# Patient Record
Sex: Female | Born: 1965 | Race: Black or African American | Hispanic: No | Marital: Married | Smoking: Never smoker
Health system: Southern US, Community
[De-identification: ages and names within clinical notes are randomized; demographics above are authoritative.]

## PROBLEM LIST (undated history)

## (undated) DIAGNOSIS — I10 Essential (primary) hypertension: Secondary | ICD-10-CM

## (undated) HISTORY — PX: BREAST REDUCTION SURGERY: SHX8

---

## 1999-11-28 ENCOUNTER — Emergency Department (HOSPITAL_COMMUNITY): Admission: EM | Admit: 1999-11-28 | Discharge: 1999-11-28 | Payer: Self-pay | Admitting: Emergency Medicine

## 1999-11-28 ENCOUNTER — Encounter: Payer: Self-pay | Admitting: Emergency Medicine

## 2014-04-30 ENCOUNTER — Emergency Department (HOSPITAL_BASED_OUTPATIENT_CLINIC_OR_DEPARTMENT_OTHER): Payer: Managed Care, Other (non HMO)

## 2014-04-30 ENCOUNTER — Encounter (HOSPITAL_BASED_OUTPATIENT_CLINIC_OR_DEPARTMENT_OTHER): Payer: Self-pay | Admitting: Emergency Medicine

## 2014-04-30 ENCOUNTER — Emergency Department (HOSPITAL_BASED_OUTPATIENT_CLINIC_OR_DEPARTMENT_OTHER)
Admission: EM | Admit: 2014-04-30 | Discharge: 2014-04-30 | Disposition: A | Discharge status: Home / Self Care | Payer: Managed Care, Other (non HMO) | Attending: Emergency Medicine | Admitting: Emergency Medicine

## 2014-04-30 DIAGNOSIS — Z79899 Other long term (current) drug therapy: Secondary | ICD-10-CM | POA: Insufficient documentation

## 2014-04-30 DIAGNOSIS — I10 Essential (primary) hypertension: Secondary | ICD-10-CM | POA: Insufficient documentation

## 2014-04-30 DIAGNOSIS — J209 Acute bronchitis, unspecified: Secondary | ICD-10-CM | POA: Diagnosis not present

## 2014-04-30 DIAGNOSIS — R05 Cough: Secondary | ICD-10-CM | POA: Diagnosis present

## 2014-04-30 HISTORY — DX: Essential (primary) hypertension: I10

## 2014-04-30 MED ORDER — GUAIFENESIN ER 1200 MG PO TB12: ORAL_TABLET | ORAL | Status: DC

## 2014-04-30 MED ORDER — ENOXAPARIN SODIUM 80 MG/0.8ML ~~LOC~~ SOLN
SUBCUTANEOUS | Status: AC
  Filled 2014-04-30: qty 0.8

## 2014-04-30 MED ORDER — DOXYCYCLINE HYCLATE 100 MG PO CAPS: 100.0000 mg | ORAL_CAPSULE | Freq: Two times a day (BID) | ORAL | Status: DC

## 2014-04-30 MED ORDER — ALBUTEROL SULFATE HFA 108 (90 BASE) MCG/ACT IN AERS
2.0000 | INHALATION_SPRAY | Freq: Once | RESPIRATORY_TRACT | Status: AC
  Administered 2014-04-30: 2 via RESPIRATORY_TRACT
  Filled 2014-04-30: qty 6.7

## 2014-04-30 MED ORDER — DOXYCYCLINE HYCLATE 100 MG PO TABS
100.0000 mg | ORAL_TABLET | Freq: Once | ORAL | Status: AC
  Administered 2014-04-30: 100 mg via ORAL
  Filled 2014-04-30: qty 1

## 2014-04-30 NOTE — ED Provider Notes (Signed)
CSN: [id number]     Arrival date & time 04/30/14  0156 History   First MD Initiated Contact with Patient 04/30/14 0421     Chief Complaint  Patient presents with  . Cough     (Consider location/radiation/quality/duration/timing/severity/associated sxs/prior Treatment) HPI  This is a 49 year old female with a one-month history of cough and shortness of breath. This began with a presumed viral illness. When symptoms persisted she was [facility] and placed on prednisone, and antibiotic and hydrocodone homatropine syrup. When she finished a course of prednisone her symptoms returned. She is here complaining of persistent cough which is only occasionally productive. She is also having wheezing and shortness of breath. Symptoms are worse at night when she is lying flat. She was given an albuterol inhaler treatment per protocol with improvement. She does not wish to be placed on steroids again. She was noted to have a low-grade fever on arrival.  Past Medical History  Diagnosis Date  . Hypertension    History reviewed. No pertinent past surgical history. History reviewed. No pertinent family history. History  Substance Use Topics  . Smoking status: Never Smoker   . Smokeless tobacco: Not on file  . Alcohol Use: No   OB History    No data available     Review of Systems  All other systems reviewed and are negative.   Allergies  Morphine and related  Home Medications   Prior to Admission medications   Medication Sig Start Date End Date Taking? Authorizing Provider  lisinopril-hydrochlorothiazide (PRINZIDE,ZESTORETIC) 20-12.5 MG per tablet Take 1 tablet by mouth daily.   Yes Historical Provider, MD   BP 135/94 mmHg  Pulse 83  Temp(Src) 99.9 F (37.7 C) (Oral)  Resp 20  Ht 5\' 11"  (1.803 m)  Wt 310 lb (140.615 kg)  BMI 43.26 kg/m2  SpO2 97%  LMP 02/22/2014 (Approximate)   Physical Exam  General: Well-developed, well-nourished female in no acute distress; appearance  consistent with age of record HENT: normocephalic; atraumatic Eyes: pupils equal, round and reactive to light; extraocular muscles intact Neck: supple Heart: regular rate and rhythm Lungs: clear to auscultation bilaterally; frequent cough Abdomen: soft; obese; nontender Extremities: No deformity; full range of motion; pulses normal Neurologic: Awake, alert and oriented; motor function intact in all extremities and symmetric; no facial droop Skin: Warm and dry Psychiatric: Normal mood and affect    ED Course  Procedures (including critical care time)   MDM  Nursing notes and vitals signs, including pulse oximetry, reviewed.  Summary of this visit's results, reviewed by myself:  Imaging Studies: Dg Chest 2 View  04/30/2014   CLINICAL DATA:  Cough, congestion, wheezing for 1 month.  EXAM: CHEST  2 VIEW  COMPARISON:  None.  FINDINGS: The heart size and mediastinal contours are within normal limits. Both lungs are clear. The visualized skeletal structures are unremarkable.  IMPRESSION: No active cardiopulmonary disease.   Electronically Signed   By: Charlett NoseKevin  Dover M.D.   On: 04/30/2014 02:41      Paula LibraJohn Brealyn Baril, MD 04/30/14 [phone]

## 2014-04-30 NOTE — ED Notes (Signed)
Patient states that she has had this cough and respiratory symptoms x 3 weeks. She was first dx with a viral illness. She then went to Urgent care 1 week later when she did not start to feel better and was dx with Bronchitis. The patient reports that she now is being woke up with the wheezing and the SOB and drenched in sweat. Patient states that she at least needs a chest x-ray.

## 2014-04-30 NOTE — Patient Instructions (Signed)
Instructed patient on the proper use of administering albuterol mndi via aerochamber patient tolerated well

## 2015-05-03 ENCOUNTER — Emergency Department (HOSPITAL_BASED_OUTPATIENT_CLINIC_OR_DEPARTMENT_OTHER): Payer: Managed Care, Other (non HMO)

## 2015-05-03 ENCOUNTER — Encounter (HOSPITAL_BASED_OUTPATIENT_CLINIC_OR_DEPARTMENT_OTHER): Payer: Self-pay | Admitting: Emergency Medicine

## 2015-05-03 ENCOUNTER — Emergency Department (HOSPITAL_BASED_OUTPATIENT_CLINIC_OR_DEPARTMENT_OTHER)
Admission: EM | Admit: 2015-05-03 | Discharge: 2015-05-04 | Disposition: A | Discharge status: Home / Self Care | Payer: Managed Care, Other (non HMO) | Attending: Emergency Medicine | Admitting: Emergency Medicine

## 2015-05-03 DIAGNOSIS — I10 Essential (primary) hypertension: Secondary | ICD-10-CM

## 2015-05-03 DIAGNOSIS — R200 Anesthesia of skin: Secondary | ICD-10-CM | POA: Insufficient documentation

## 2015-05-03 DIAGNOSIS — R350 Frequency of micturition: Secondary | ICD-10-CM | POA: Insufficient documentation

## 2015-05-03 DIAGNOSIS — M5442 Lumbago with sciatica, left side: Secondary | ICD-10-CM | POA: Insufficient documentation

## 2015-05-03 DIAGNOSIS — M545 Low back pain: Secondary | ICD-10-CM | POA: Diagnosis present

## 2015-05-03 DIAGNOSIS — M7989 Other specified soft tissue disorders: Secondary | ICD-10-CM | POA: Diagnosis not present

## 2015-05-03 DIAGNOSIS — R05 Cough: Secondary | ICD-10-CM | POA: Insufficient documentation

## 2015-05-03 DIAGNOSIS — Z79899 Other long term (current) drug therapy: Secondary | ICD-10-CM | POA: Insufficient documentation

## 2015-05-03 MED ORDER — OXYCODONE-ACETAMINOPHEN 5-325 MG PO TABS
1.0000 | ORAL_TABLET | Freq: Once | ORAL | Status: AC
  Administered 2015-05-03: 1 via ORAL
  Filled 2015-05-03: qty 1

## 2015-05-03 MED ORDER — DIAZEPAM 2 MG PO TABS
2.0000 mg | ORAL_TABLET | Freq: Once | ORAL | Status: AC
  Administered 2015-05-03: 2 mg via ORAL
  Filled 2015-05-03: qty 1

## 2015-05-03 NOTE — ED Provider Notes (Signed)
CSN: [id number]     Arrival date & time 05/03/15  1950 History   First MD Initiated Contact with Patient 05/03/15 2142     Chief Complaint  Patient presents with  . Back Pain     (Consider location/radiation/quality/duration/timing/severity/associated sxs/prior Treatment) HPI Comments: Patient reports to ED with complaint of low back pain. Pain initially started on Sunday. No known trauma or recent change in activity. Pain is predominantly left sided, 10/10, sharp in nature, with radiation down left leg. Associated weakness. Patient reports increase frequency and sensation of needing to use the restroom; however, unable to hold bladder. No fever or IVD use. Patient has taken  ibuprofen every 6 hours and a muscle relaxer with some relief of symptoms.   Patient is a 50 y.o. female presenting with back pain. The history is provided by the patient.  Back Pain Location:  Lumbar spine Quality:  Shooting Radiates to:  L posterior upper leg Pain severity:  Severe Associated symptoms: numbness and weakness   Associated symptoms: no abdominal pain, no chest pain, no dysuria, no fever and no headaches     Past Medical History  Diagnosis Date  . Hypertension    Past Surgical History  Procedure Laterality Date  . Cesarean section    . Breast reduction surgery     History reviewed. No pertinent family history. Social History  Substance Use Topics  . Smoking status: Never Smoker   . Smokeless tobacco: None  . Alcohol Use: No   OB History    No data available     Review of Systems  Constitutional: Positive for chills ( secondary to menopause) and diaphoresis ( secondary to menopause). Negative for fever.  HENT: Negative for sore throat.   Eyes: Negative for visual disturbance.  Respiratory: Positive for cough ( no hemoptysis) and shortness of breath ( No recent h/o cancer, no h/o blood clots, no recent  immobilization or surgery, no exogenous estrogen use).   Cardiovascular:  Positive for leg swelling (Chronic). Negative for chest pain.  Gastrointestinal: Negative for nausea, vomiting, abdominal pain, diarrhea, constipation and blood in stool.  Genitourinary: Positive for frequency. Negative for dysuria and hematuria.  Musculoskeletal: Positive for back pain.  Skin: Negative for rash.  Neurological: Positive for weakness and numbness. Negative for dizziness, light-headedness and headaches.      Allergies  Morphine and related  Home Medications   Prior to Admission medications   Medication Sig Start Date End Date Taking? Authorizing Provider  lisinopril-hydrochlorothiazide (PRINZIDE,ZESTORETIC) 20-12.5 MG tablet Take 1 tablet by mouth daily. 05/04/15   Lona Kettle, PA-C  methocarbamol (ROBAXIN) 500 MG tablet Take 1 tablet (500 mg total) by mouth 2 (two) times daily. 05/04/15   Lona Kettle, PA-C  naproxen (NAPROSYN) 500 MG tablet Take 1 tablet (500 mg total) by mouth 2 (two) times daily with a meal. 05/04/15   Lona Kettle, PA-C   BP 149/100 mmHg  Pulse 74  Temp(Src) 98 F (36.7 C) (Oral)  Resp 18  Ht  (1.778 m)  Wt 175.406 kg  BMI 55.49 kg/m2  SpO2 96% Physical Exam  Constitutional: She appears well-developed and well-nourished. She appears distressed.  HENT:  Head: Normocephalic and atraumatic.  Eyes: Conjunctivae are normal. Pupils are equal, round, and reactive to light. No scleral icterus.  Neck: Normal range of motion. Neck supple.  Cardiovascular: Normal rate, regular rhythm, normal heart sounds and intact distal pulses.   No murmur heard. Pulmonary/Chest: Effort normal and breath sounds normal.  No respiratory distress.  Abdominal: Soft. There is no tenderness. There is no rebound and no guarding.  Genitourinary:  Rectal tone normal on exam.   Lymphadenopathy:    She has no cervical adenopathy.  Neurological: She is alert. She displays no Babinski's sign on the right side. She displays no Babinski's sign on the  left side.  Unable to obtain good DTRs secondary to body habitus; however, response the same b/l. Sensation is grossly intact in left lower extremity. Mild decrease in sensation in right lower extremity. Strength in left and right lower extremity intact. Patient able to ambulate, slow in nature and with a slight limp to left secondary to pain.   Skin: Skin is warm and dry. She is not diaphoretic.  Psychiatric: She has a normal mood and affect.    ED Course  Procedures (including critical care time) Labs Review Labs Reviewed  PREGNANCY, URINE    Imaging Review Dg Lumbar Spine 2-3 Views  05/04/2015  CLINICAL DATA:  Low back pain with left leg pain for 3 days. EXAM: LUMBAR SPINE - 2-3 VIEW COMPARISON:  09/25/2014 FINDINGS: No acute fracture, endplate erosion, or evidence of focal bone lesion. Advanced lower lumbar facet arthropathy with bulky spurring at L5-S1. Diffuse spondylosis without focal or advanced disc narrowing. IMPRESSION: Degenerative findings without change since 2016. No superimposed acute finding. Electronically Signed   By: Marnee SpringJonathon  Watts M.D.   On: 05/04/2015 00:56   I have personally reviewed and evaluated these images and lab results as part of my medical decision-making. No fracture noted, no disc narrowing noted. Air in intestines and stool noted in intestines. Evaluation is in line with radiology interpretation.    EKG Interpretation None      MDM   Final diagnoses:  Left-sided low back pain with left-sided sciatica  Essential hypertension   Patient presents with left sided low back pain with radiation into left lower extremity x 3 days. She has had some relief of symptoms with ibuprofen and muscle relaxer at home. Patient is afebrile and denies IVD use - less concern for epidural abscess. Patient has sensation of needing to use the restroom; however, has had some issues with the ability to hold her urine. No loss of bowel function.  Rectal tone normal, sensation in  left lower extremity intact, strength intact - less concern for cauda equina syndrome. X-ray of lumbar spine showed no acute findings. Patient received valium and percocet in ED with improvement in pain from 10 to 7. She was able to ambulate to the restroom, to x-ray, and in her room. She had no loss of bladder function while in the ED. With radiation into left lower extremity, pain is most likely sciatica. Gave naprosyn and robaxin for symptomatic relief. Told patient to take naprosyn instead of ibuprofen. X-ray did show stool, encouraged patient to take miralax. Encouraged patient to follow up with orthopedics in the next three days for further evaluation and management.   Patient was concerned with her shortness of breath regarding blood clot. Patient's lungs were clear to auscultation. Well's criteria 0. PERC negative.   Of note, patient's blood pressure remarkably high. Reports she has not taken her blood pressure medication for some time due to change in PCP. Upon recheck of her BP prior to discharge, BP improved. Will send home with outpatient medication lisinopril-hctz. Encouraged patient to establish PCP, provided resources for possible PCPs in the area.   Patient voiced understanding of treatment plan and is agreeable.  Lona Kettle, PA-C 05/04/15 0435  Lona Kettle, PA-C 05/04/15 1059  Rolland Porter, MD 05/14/15 [phone]

## 2015-05-03 NOTE — ED Notes (Signed)
C/o back pain since Sunday denies inj

## 2015-05-03 NOTE — ED Notes (Signed)
Pt states she has been having back pain since Sunday, denies any injury

## 2015-05-04 ENCOUNTER — Encounter (HOSPITAL_BASED_OUTPATIENT_CLINIC_OR_DEPARTMENT_OTHER): Payer: Self-pay | Admitting: Student

## 2015-05-04 LAB — PREGNANCY, URINE: PREG TEST UR: NEGATIVE

## 2015-05-04 MED ORDER — LISINOPRIL-HYDROCHLOROTHIAZIDE 20-12.5 MG PO TABS: 1.0000 | ORAL_TABLET | Freq: Every day | ORAL | Status: AC

## 2015-05-04 MED ORDER — METHOCARBAMOL 500 MG PO TABS: 500.0000 mg | ORAL_TABLET | Freq: Two times a day (BID) | ORAL | Status: AC

## 2015-05-04 MED ORDER — NAPROXEN 500 MG PO TABS: 500.0000 mg | ORAL_TABLET | Freq: Two times a day (BID) | ORAL | Status: AC

## 2015-05-04 NOTE — Discharge Instructions (Signed)
Take Naprosyn for pain relief and inflammation. Take robaxin for relief of muscle spasms. There was constipation noted on your x-ray, start taking miralax to ease with bowel movements. Follow up in three days with Dr. Pearletha ForgeHudnall, orthopedics. Return to ED if develop worsening pain, fever, loss of bowel or bladder function, or chest pain.   Sciatica Sciatica is pain, weakness, numbness, or tingling along your sciatic nerve. The nerve starts in the lower back and runs down the back of each leg. Nerve damage or certain conditions pinch or put pressure on the sciatic nerve. This causes the pain, weakness, and other discomforts of sciatica. HOME CARE   Only take medicine as told by your doctor.  Apply ice to the affected area for 20 minutes. Do this 3-4 times a day for the first 48-72 hours. Then try heat in the same way.  Exercise, stretch, or do your usual activities if these do not make your pain worse.  Go to physical therapy as told by your doctor.  Keep all doctor visits as told.  Do not wear high heels or shoes that are not supportive.  Get a firm mattress if your mattress is too soft to lessen pain and discomfort. GET HELP RIGHT AWAY IF:   You cannot control when you poop (bowel movement) or pee (urinate).  You have more weakness in your lower back, lower belly (pelvis), butt (buttocks), or legs.  You have redness or puffiness (swelling) of your back.  You have a burning feeling when you pee.  You have pain that gets worse when you lie down.  You have pain that wakes you from your sleep.  Your pain is worse than past pain.  Your pain lasts longer than 4 weeks.  You are suddenly losing weight without reason. MAKE SURE YOU:   Understand these instructions.  Will watch this condition.  Will get help right away if you are not doing well or get worse.   This information is not intended to replace advice given to you by your health care provider. Make sure you discuss any  questions you have with your health care provider.   Document Released: 10/04/2007 Document Revised: 09/15/2014 Document Reviewed: 05/06/2011 Elsevier Interactive Patient Education [employer]

## 2017-12-09 IMAGING — DX DG LUMBAR SPINE 2-3V
6 series · 6 of 6 positions shown · non-contrast
Comparison: 09/25/2014

CLINICAL DATA: Low back pain with left leg pain for 3 days.

EXAM:
LUMBAR SPINE - 2-3 VIEW

[l-spine ap (1 of 2)]
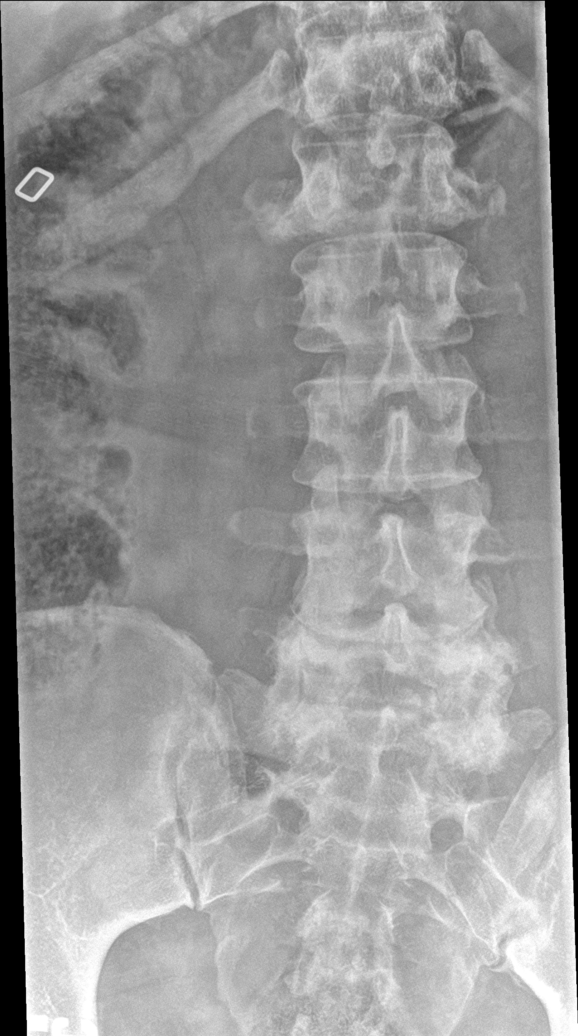

[l-spine lat (1 of 2)]
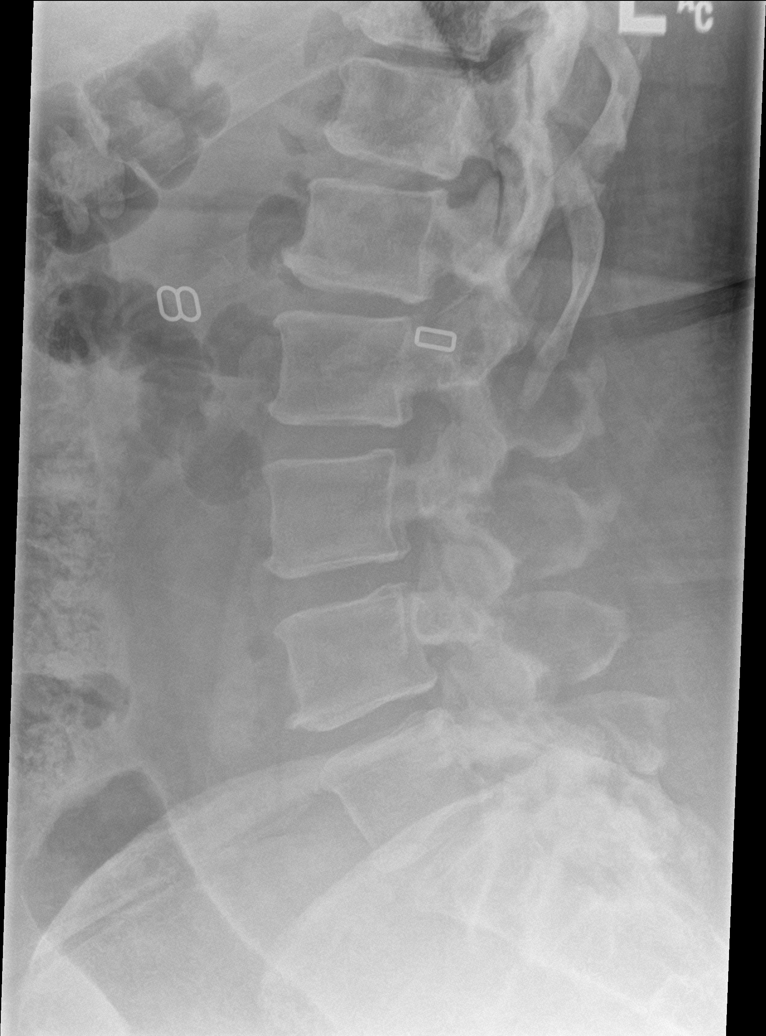

[l-spine spot (1 of 2)]
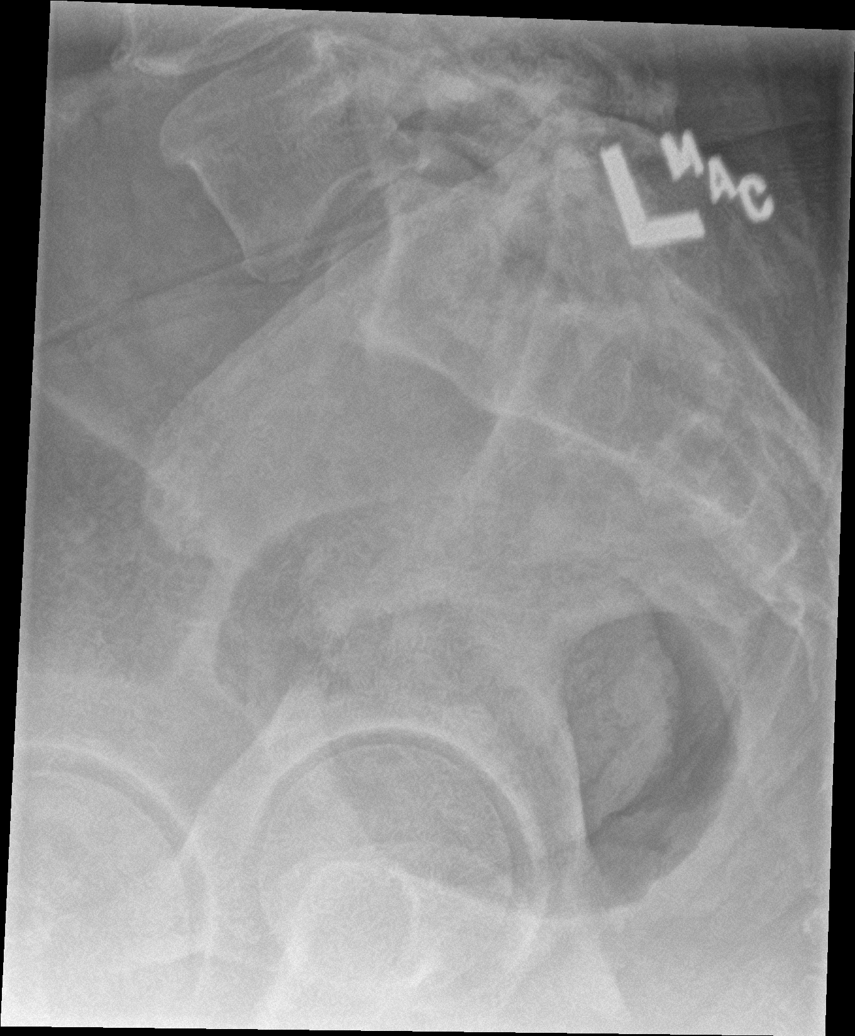

[l-spine ap (2 of 2)]
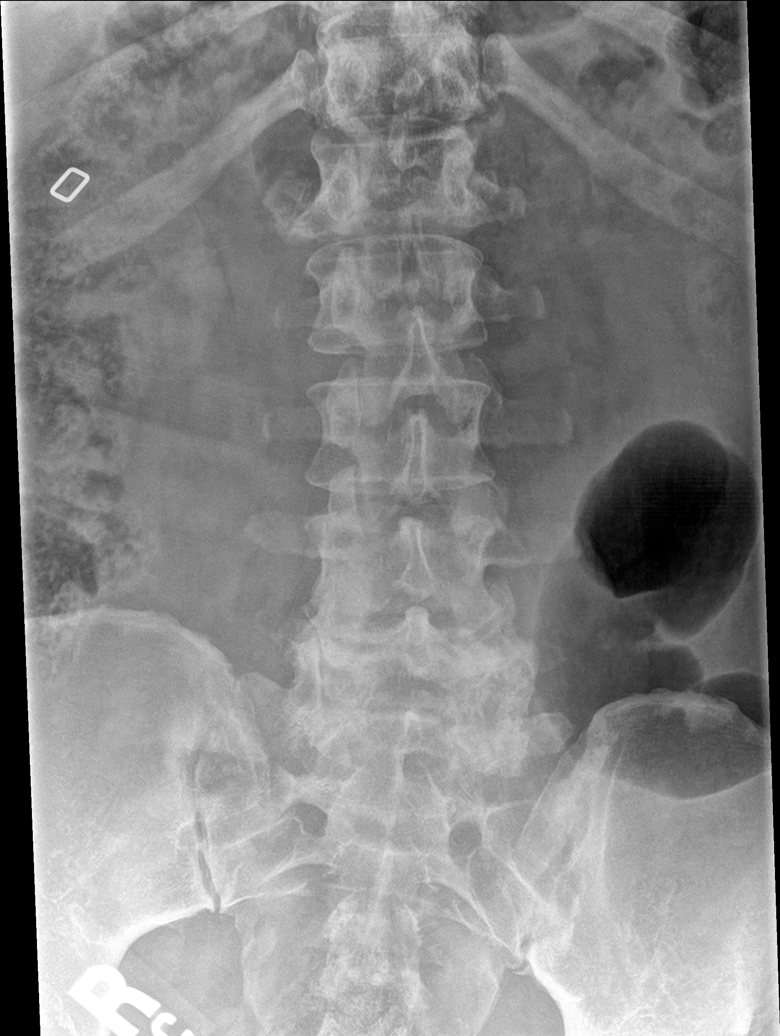

[l-spine lat (2 of 2)]
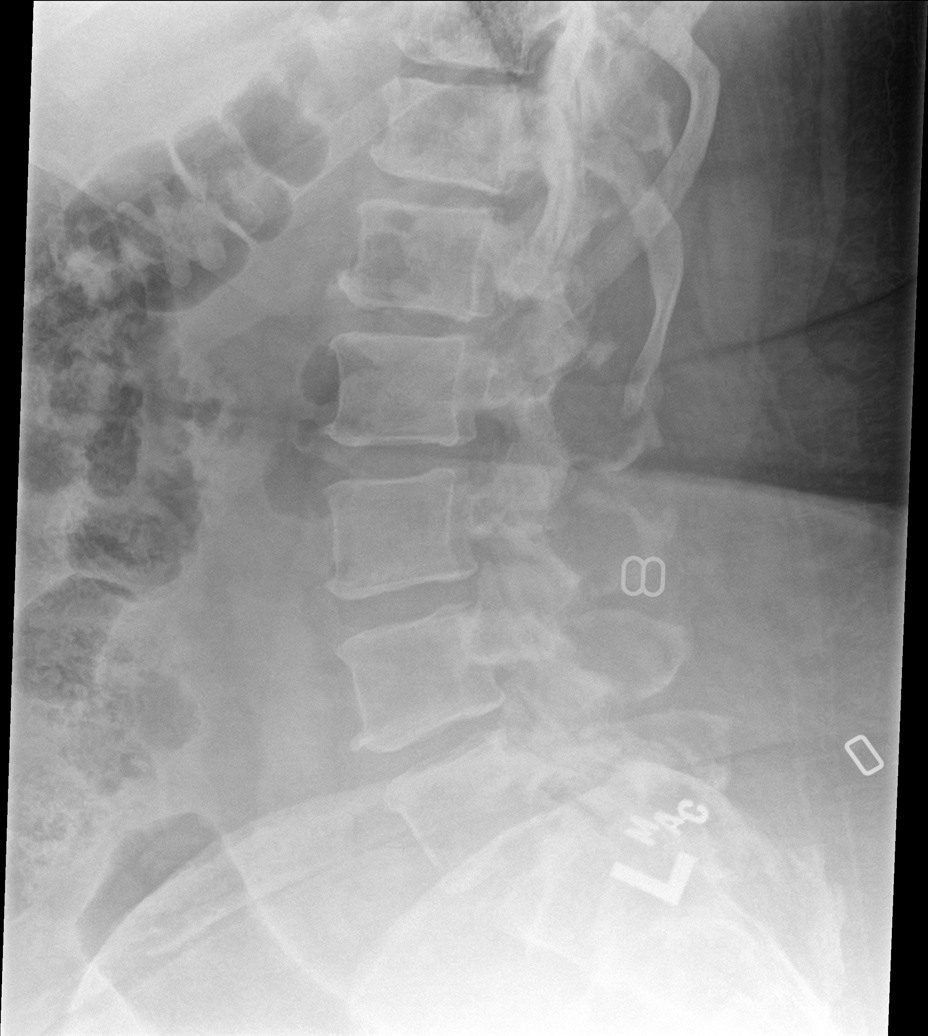

[l-spine spot (2 of 2)]
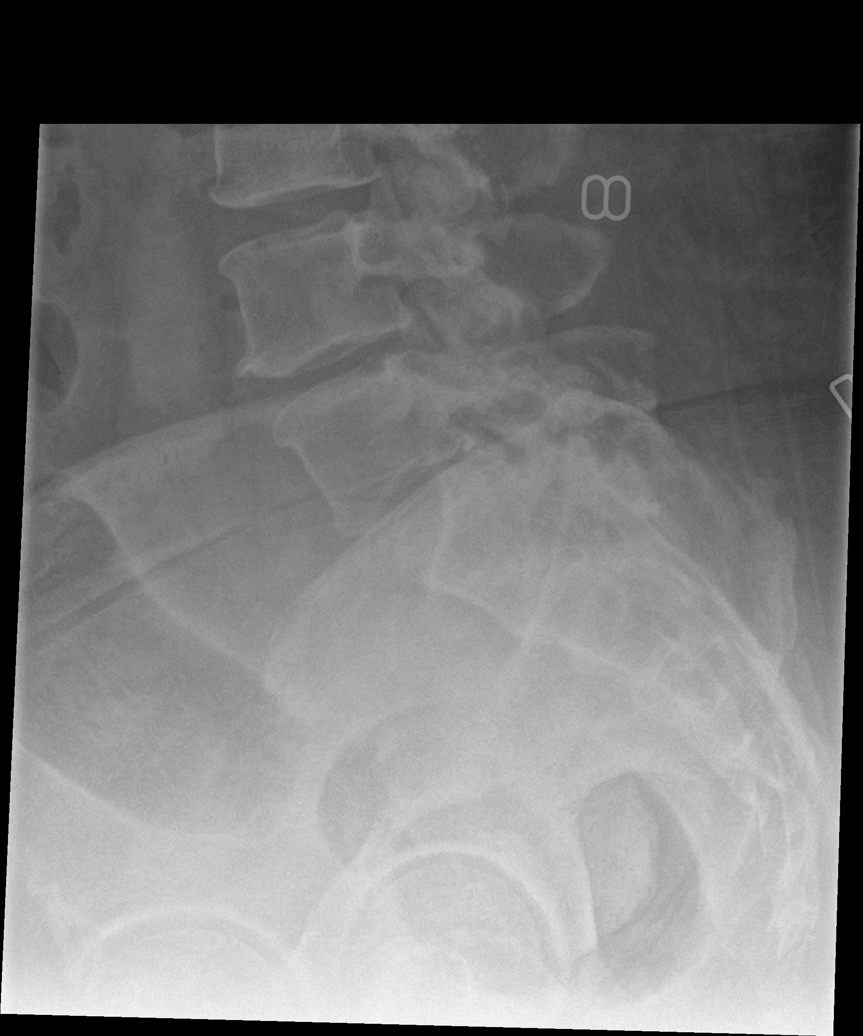

[6 of 6 positions shown; findings below may reference images not displayed]

FINDINGS: No acute fracture, endplate erosion, or evidence of focal bone
lesion.

Advanced lower lumbar facet arthropathy with bulky spurring at
L5-S1. Diffuse spondylosis without focal or advanced disc narrowing.
IMPRESSION: Degenerative findings without change since 6370. No superimposed
acute finding.
